# Patient Record
Sex: Male | Born: 1990 | Race: Black or African American | Hispanic: No | Marital: Single | Smoking: Never smoker
Health system: Southern US, Community
[De-identification: ages and names within clinical notes are randomized; demographics above are authoritative.]

---
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
## 2009-07-09 ENCOUNTER — Emergency Department: Payer: Self-pay | Admitting: Emergency Medicine

## 2011-02-20 ENCOUNTER — Emergency Department: Payer: Self-pay | Admitting: Emergency Medicine

## 2021-12-05 ENCOUNTER — Other Ambulatory Visit: Payer: Self-pay

## 2021-12-05 ENCOUNTER — Emergency Department
Admission: EM | Admit: 2021-12-05 | Discharge: 2021-12-05 | Disposition: A | Discharge status: Home / Self Care | Payer: Self-pay | Attending: Emergency Medicine | Admitting: Emergency Medicine

## 2021-12-05 ENCOUNTER — Emergency Department: Payer: Self-pay

## 2021-12-05 DIAGNOSIS — M722 Plantar fascial fibromatosis: Secondary | ICD-10-CM | POA: Insufficient documentation

## 2021-12-05 DIAGNOSIS — Y9367 Activity, basketball: Secondary | ICD-10-CM | POA: Insufficient documentation

## 2021-12-05 MED ORDER — KETOROLAC TROMETHAMINE 30 MG/ML IJ SOLN
30.0000 mg | Freq: Once | INTRAMUSCULAR | Status: AC
  Administered 2021-12-05: 30 mg via INTRAMUSCULAR
  Filled 2021-12-05: qty 1

## 2021-12-05 MED ORDER — MELOXICAM 15 MG PO TABS: 15.0000 mg | ORAL_TABLET | Freq: Every day | ORAL | 0 refills | Status: DC

## 2021-12-05 NOTE — ED Provider Notes (Signed)
[facility] Provider Note  Patient Contact: 7:20 PM (approximate)   History   Foot Pain   HPI  Cameron Whitney is a 31 y.o. male who presents the emergency department complaining of left medial foot pain.  Patient states that he was playing basketball, does not remember injuring his foot but is slowly developed very sharp pain along the medial aspect of his foot around his arch.  No history of same.  Again no direct trauma is reported.  Patient has been taking Tylenol for symptom relief.  No other complaints at this time.     Physical Exam   Triage Vital Signs: ED Triage Vitals  Enc Vitals Group     BP 12/05/21 1905 (!) 158/94     Pulse Rate 12/05/21 1905 72     Resp 12/05/21 1905 16     Temp 12/05/21 1905 (!) 97.5 F (36.4 C)     Temp Source 12/05/21 1905 Oral     SpO2 12/05/21 1905 98 %     Weight 12/05/21 1906 230 lb (104.3 kg)     Height 12/05/21 1906 6\' 2"  (1.88 m)     Head Circumference --      Peak Flow --      Pain Score 12/05/21 1906 10     Pain Loc --      Pain Edu? --      Excl. in GC? --     Most recent vital signs: Vitals:   12/05/21 1905  BP: (!) 158/94  Pulse: 72  Resp: 16  Temp: (!) 97.5 F (36.4 C)  SpO2: 98%     General: Alert and in no acute distress.  Cardiovascular:  Good peripheral perfusion Respiratory: Normal respiratory effort without tachypnea or retractions. Lungs CTAB.  Musculoskeletal: Full range of motion to all extremities.  Moderate edema noted about the left medial foot.  No open wounds, abrasions, lacerations.  Tenderness to palpation of the area.  No palpable abnormality.  Dorsalis pedis pulse and sensation intact to the foot. Neurologic:  No gross focal neurologic deficits are appreciated.  Skin:   No rash noted Other:   ED Results / Procedures / Treatments   Labs (all labs ordered are listed, but only abnormal results are displayed) Labs Reviewed - No data to  display   EKG     RADIOLOGY  I personally viewed and evaluated these images as part of my medical decision making, as well as reviewing the written report by the radiologist.  ED Provider Interpretation: No acute traumatic finding on x-ray of the foot  DG Foot Complete Left  Result Date: 12/05/2021 CLINICAL DATA:  Left foot pain and edema after basketball. EXAM: LEFT FOOT - COMPLETE 3+ VIEW COMPARISON:  Left first toe 02/20/2011 FINDINGS: Old displaced ossicle at the first interphalangeal joint likely sequela of old trauma. No evidence of acute fracture or dislocation. No destructive bone lesions. Bone cortex appears intact. Joint spaces are normal. Soft tissues are unremarkable. IMPRESSION: No acute bony abnormalities identified. Electronically Signed   By: 02/22/2011 M.D.   On: 12/05/2021 19:44    PROCEDURES:  Critical Care performed: No  Procedures   MEDICATIONS ORDERED IN ED: Medications  ketorolac (TORADOL) 30 MG/ML injection 30 mg (has no administration in time range)     IMPRESSION / MDM / ASSESSMENT AND PLAN / ED COURSE  I reviewed the triage vital signs and the nursing notes.  Differential diagnosis includes, but is not limited to, foot sprain, stress fracture, plantar fasciitis   Patient's diagnosis is consistent with plantar fasciitis.  Patient presented to the emergency department complaining of left foot pain along the plantar aspect.  States that he was playing basketball with no acute injury but states that he was wearing some inappropriate shoes for the first part, eventually switched over to his basketball shoes.  Patient is having pain mostly about the arch of the foot extending through the midfoot.  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  X-ray obtained revealed no acute traumatic findings.  He will have anti-inflammatories for symptom relief.  Follow-up primary care as needed.  Discussed use of orthotics or inserts to  help prevent this in the future..  Patient is given ED precautions to return to the ED for any worsening or new symptoms.        FINAL CLINICAL IMPRESSION(S) / ED DIAGNOSES   Final diagnoses:  Plantar fasciitis of left foot     Rx / DC Orders   ED Discharge Orders          Ordered    meloxicam (MOBIC) 15 MG tablet  Daily,   Status:  Discontinued        12/05/21 2011    meloxicam (MOBIC) 15 MG tablet  Daily        12/05/21 2011             Note:  This document was prepared using Dragon voice recognition software and may include unintentional dictation errors.   Lanette Hampshire 12/05/21 2012    Sharman Cheek, MD 12/06/21 [phone]

## 2021-12-05 NOTE — ED Triage Notes (Signed)
Pt presents to ER from home c/o left foot pain that started around 2200 yesterday.  Pt states he was playing basketball and may have been wearing the wrong shoe.  Pt denies any direct injury to foot that he can remember.  Pt walking with a limp to triage.  Pt A&O x4 at this time in NAD.   ?

## 2021-12-15 ENCOUNTER — Emergency Department: Payer: No Typology Code available for payment source

## 2021-12-15 ENCOUNTER — Encounter: Payer: Self-pay | Admitting: Emergency Medicine

## 2021-12-15 ENCOUNTER — Emergency Department
Admission: EM | Admit: 2021-12-15 | Discharge: 2021-12-15 | Disposition: A | Discharge status: Home / Self Care | Payer: No Typology Code available for payment source | Attending: Emergency Medicine | Admitting: Emergency Medicine

## 2021-12-15 ENCOUNTER — Other Ambulatory Visit: Payer: Self-pay

## 2021-12-15 DIAGNOSIS — Y92410 Unspecified street and highway as the place of occurrence of the external cause: Secondary | ICD-10-CM | POA: Diagnosis not present

## 2021-12-15 DIAGNOSIS — S0003XA Contusion of scalp, initial encounter: Secondary | ICD-10-CM | POA: Diagnosis not present

## 2021-12-15 DIAGNOSIS — S8001XA Contusion of right knee, initial encounter: Secondary | ICD-10-CM | POA: Insufficient documentation

## 2021-12-15 DIAGNOSIS — M25461 Effusion, right knee: Secondary | ICD-10-CM | POA: Insufficient documentation

## 2021-12-15 DIAGNOSIS — S0990XA Unspecified injury of head, initial encounter: Secondary | ICD-10-CM | POA: Diagnosis present

## 2021-12-15 MED ORDER — BACLOFEN 10 MG PO TABS: 10.0000 mg | ORAL_TABLET | Freq: Three times a day (TID) | ORAL | 0 refills | Status: AC

## 2021-12-15 MED ORDER — MELOXICAM 15 MG PO TABS: 15.0000 mg | ORAL_TABLET | Freq: Every day | ORAL | 0 refills | Status: AC
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
## 2023-05-19 IMAGING — CR DG KNEE COMPLETE 4+V*R*
4 series · 4 of 4 positions shown · non-contrast
Comparison: None.

CLINICAL DATA: Right knee pain

EXAM:
RIGHT KNEE - COMPLETE 4+ VIEW

[knee ap]
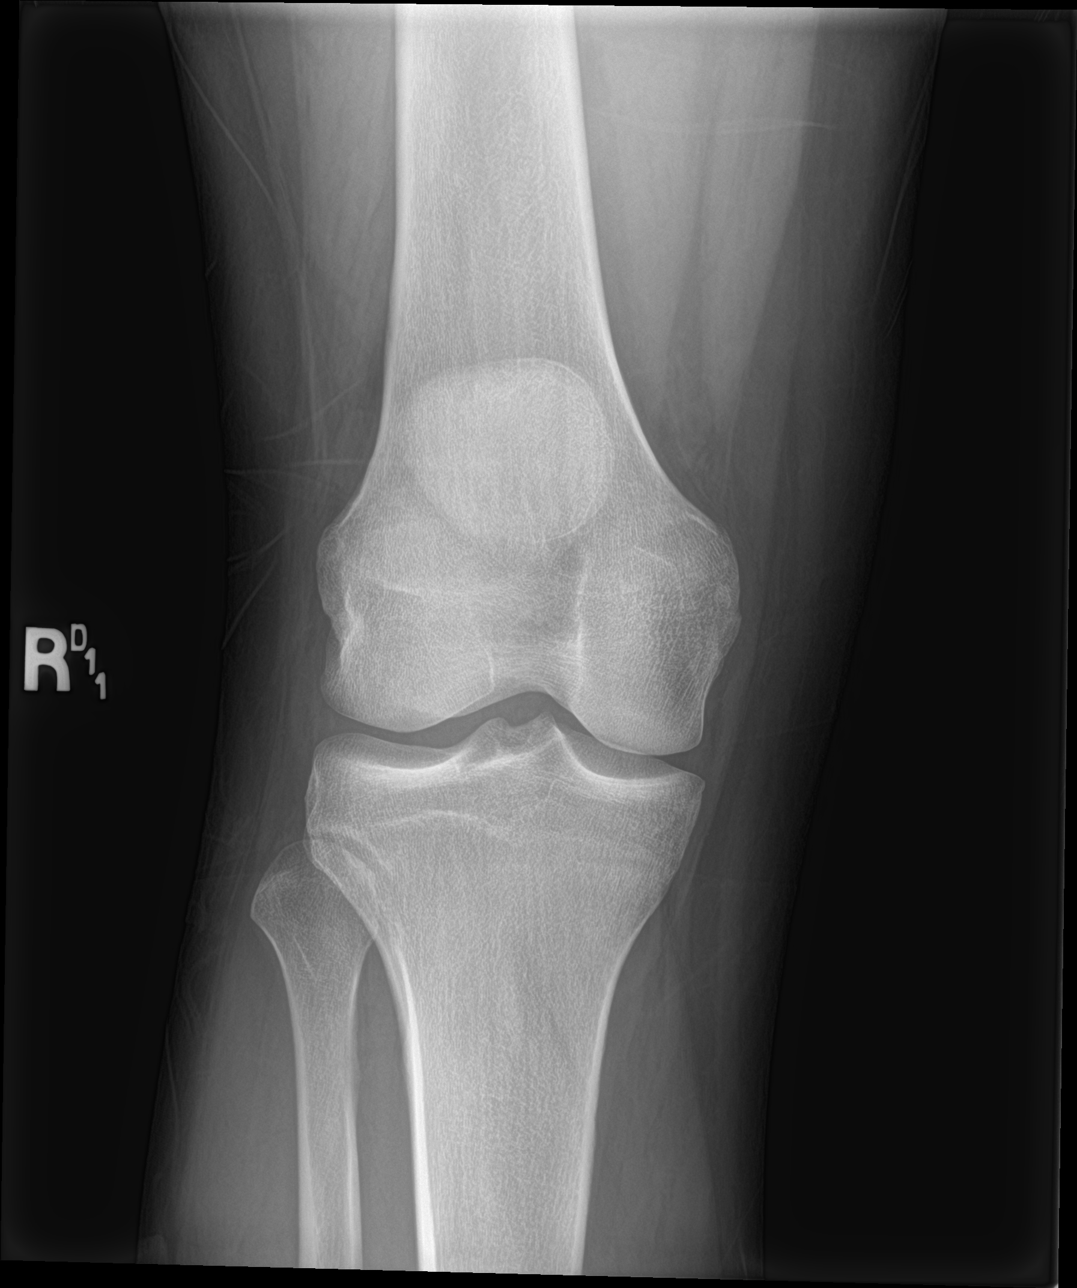

[knee obl (1 of 2)]
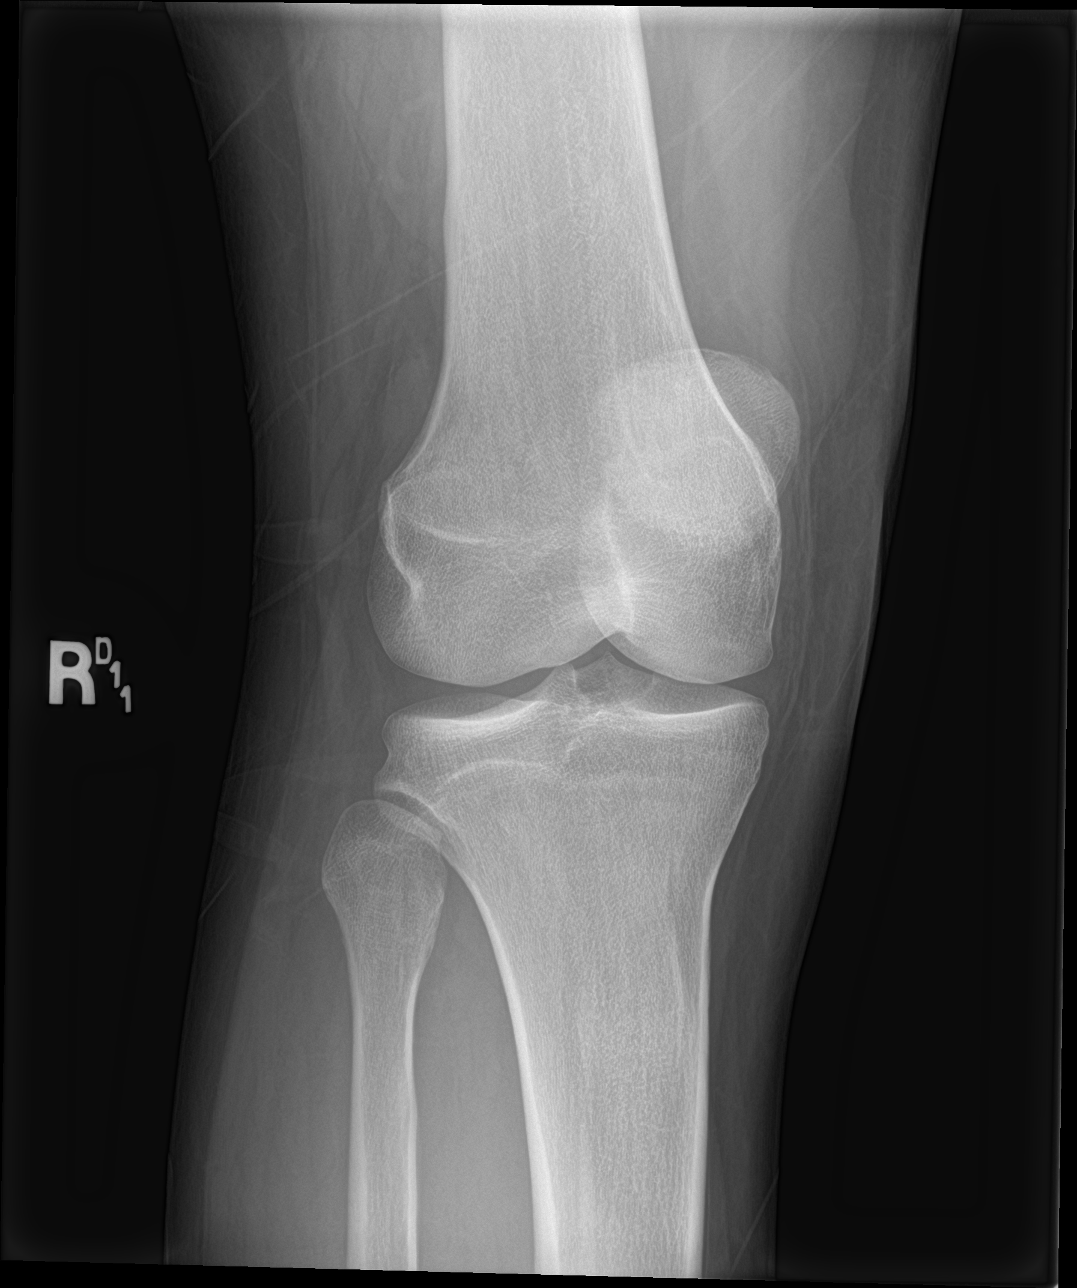

[knee obl (2 of 2)]
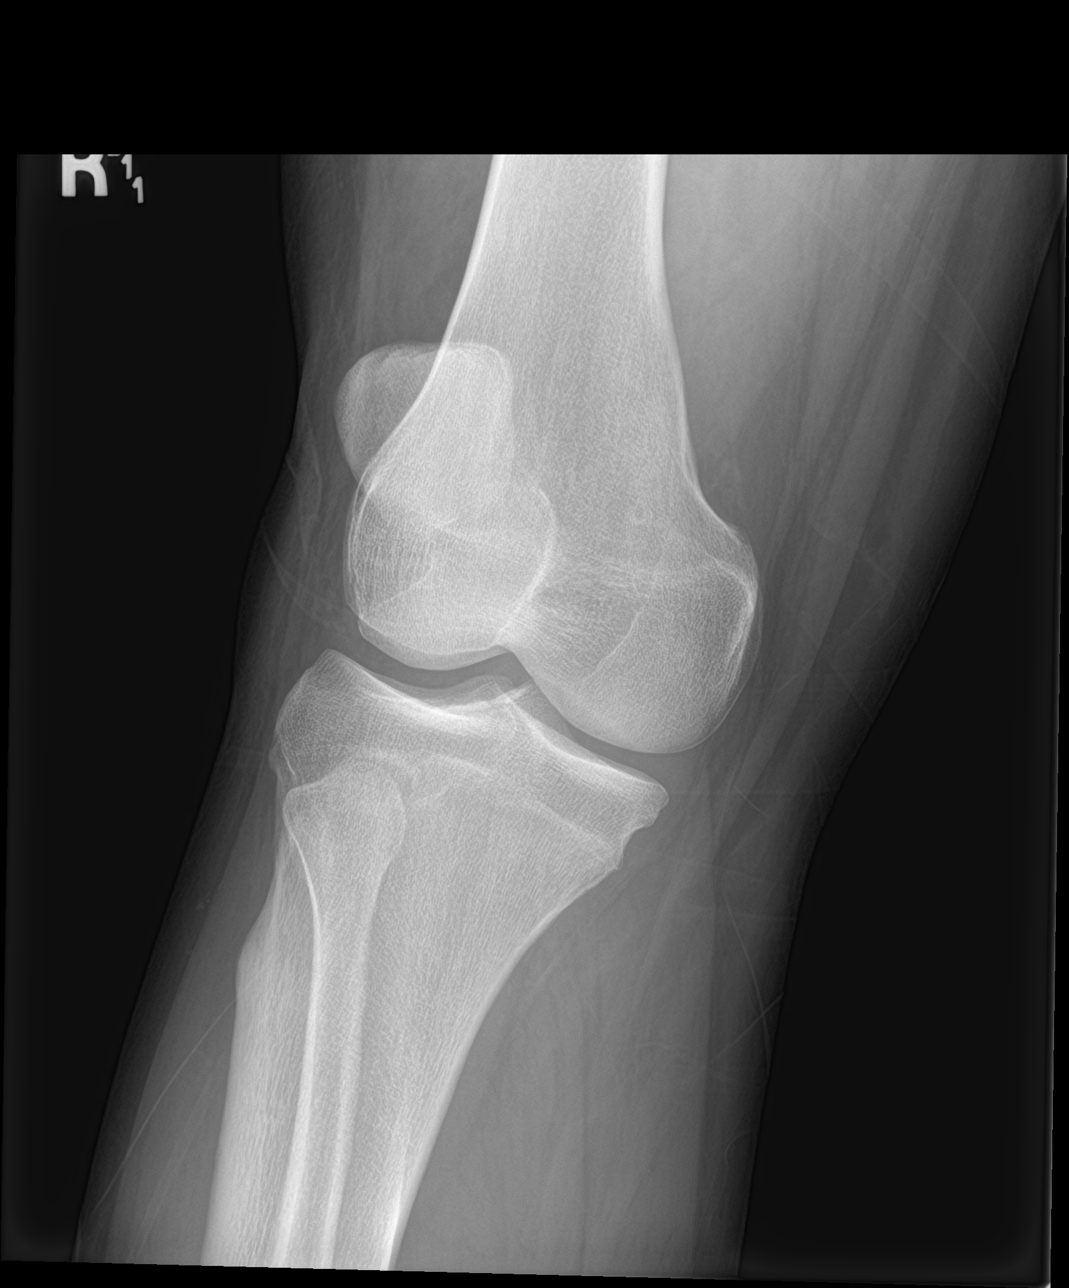

[knee lat]
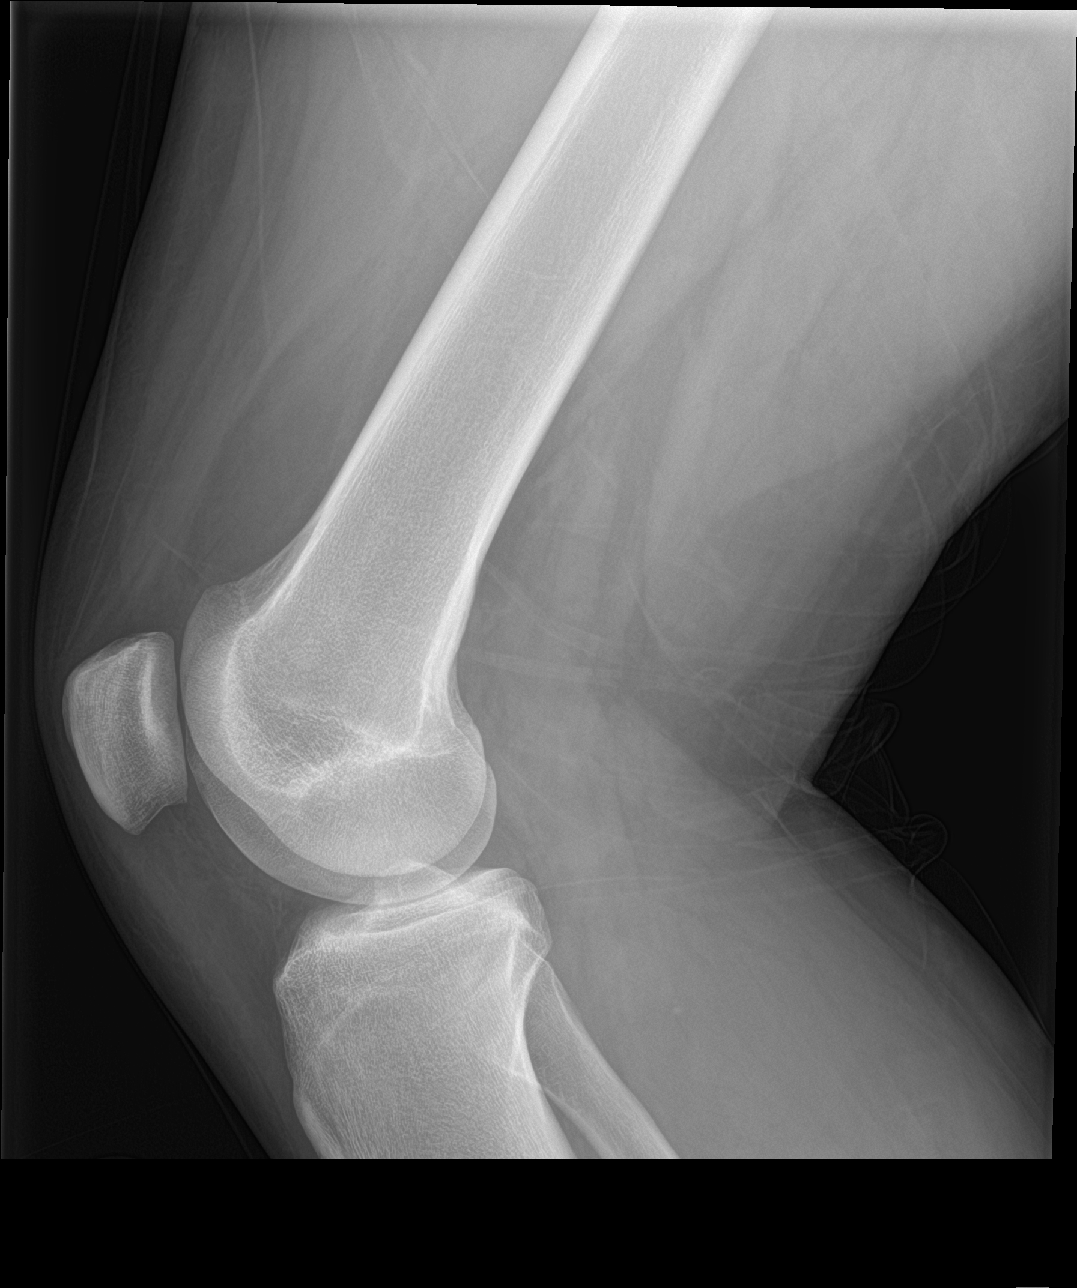

[4 of 4 positions shown; findings below may reference images not displayed]

FINDINGS: There is no visible fracture. No dislocation. There is a small joint
effusion.
IMPRESSION: No visible fracture.  Small joint effusion.
